# Patient Record
Sex: Female | Born: 1960 | Race: Black or African American | Hispanic: No | Marital: Married | Smoking: Never smoker
Health system: Southern US, Community
[De-identification: ages and names within clinical notes are randomized; demographics above are authoritative.]

---

## 1998-02-08 ENCOUNTER — Other Ambulatory Visit: Admission: RE | Admit: 1998-02-08 | Discharge: 1998-02-08 | Payer: Self-pay | Admitting: Internal Medicine

## 1998-06-05 ENCOUNTER — Other Ambulatory Visit: Admission: RE | Admit: 1998-06-05 | Discharge: 1998-06-05 | Payer: Self-pay | Admitting: Internal Medicine

## 1999-02-19 ENCOUNTER — Other Ambulatory Visit: Admission: RE | Admit: 1999-02-19 | Discharge: 1999-02-19 | Payer: Self-pay | Admitting: Internal Medicine

## 1999-08-08 ENCOUNTER — Encounter: Admission: RE | Admit: 1999-08-08 | Discharge: 1999-08-08 | Payer: Self-pay | Admitting: Internal Medicine

## 1999-08-08 ENCOUNTER — Encounter: Payer: Self-pay | Admitting: Internal Medicine

## 2000-03-19 ENCOUNTER — Other Ambulatory Visit: Admission: RE | Admit: 2000-03-19 | Discharge: 2000-03-19 | Payer: Self-pay | Admitting: Internal Medicine

## 2001-03-29 ENCOUNTER — Other Ambulatory Visit: Admission: RE | Admit: 2001-03-29 | Discharge: 2001-03-29 | Payer: Self-pay | Admitting: Internal Medicine

## 2002-04-13 ENCOUNTER — Encounter: Admission: RE | Admit: 2002-04-13 | Discharge: 2002-04-13 | Payer: Self-pay | Admitting: Internal Medicine

## 2002-04-13 ENCOUNTER — Encounter: Payer: Self-pay | Admitting: Internal Medicine

## 2005-02-27 ENCOUNTER — Encounter: Admission: RE | Admit: 2005-02-27 | Discharge: 2005-02-27 | Payer: Self-pay | Admitting: Internal Medicine

## 2007-01-31 IMAGING — CR DG FOOT COMPLETE 3+V*L*
3 series · 3 of 3 positions shown · non-contrast
Comparison: None.

CLINICAL DATA: Pain in the region of the fifth metatarsal.  Question stress fracture. 
LEFT FOOT ? 3 VIEW:

[view not recorded (1 of 3)]
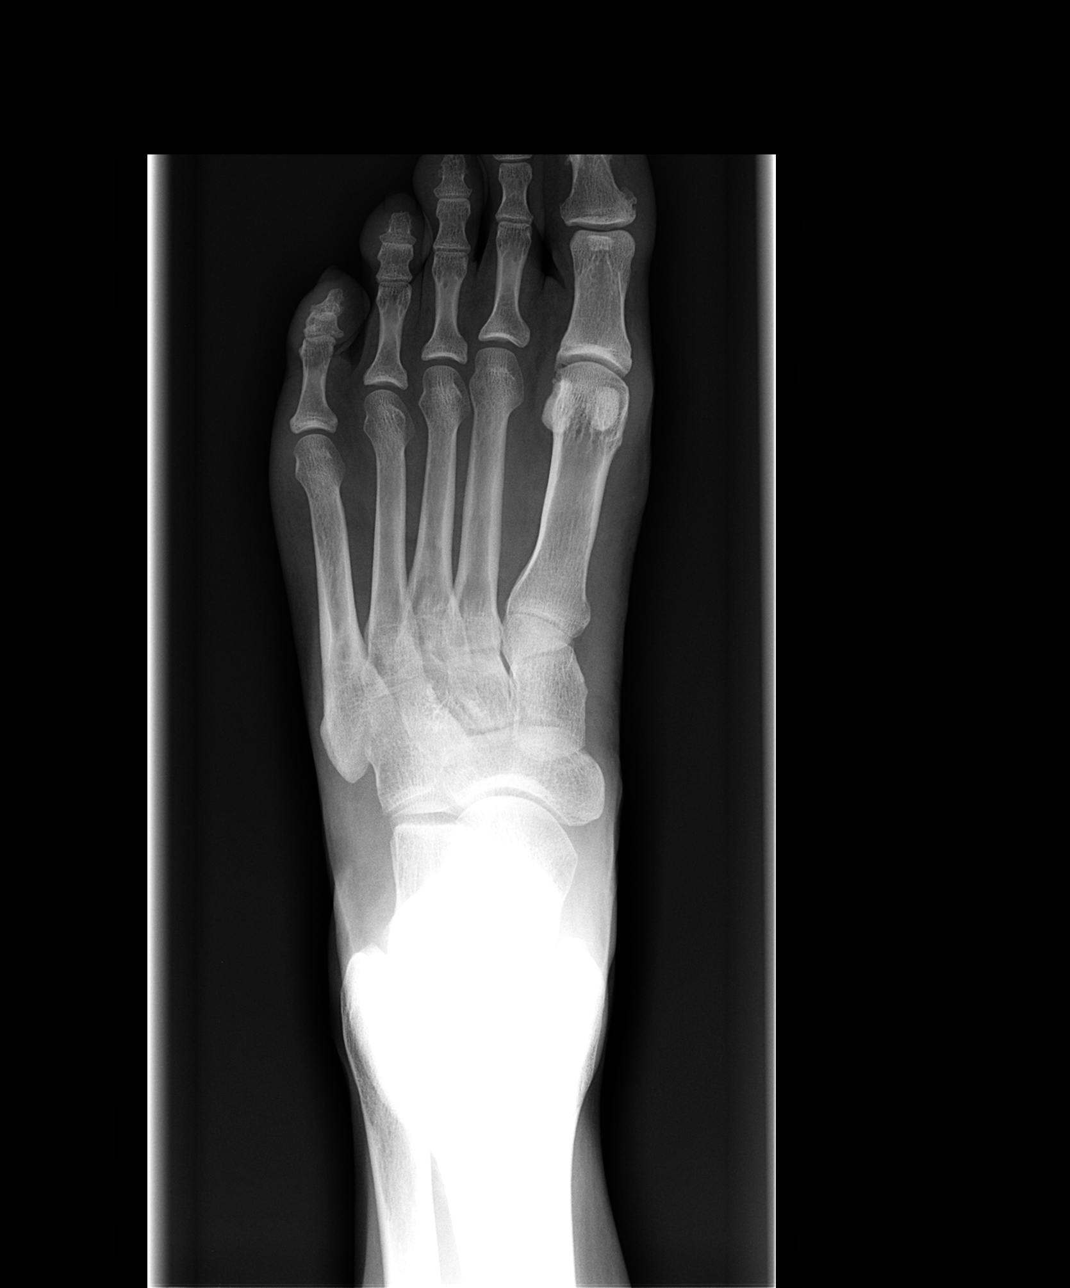

[view not recorded (2 of 3)]
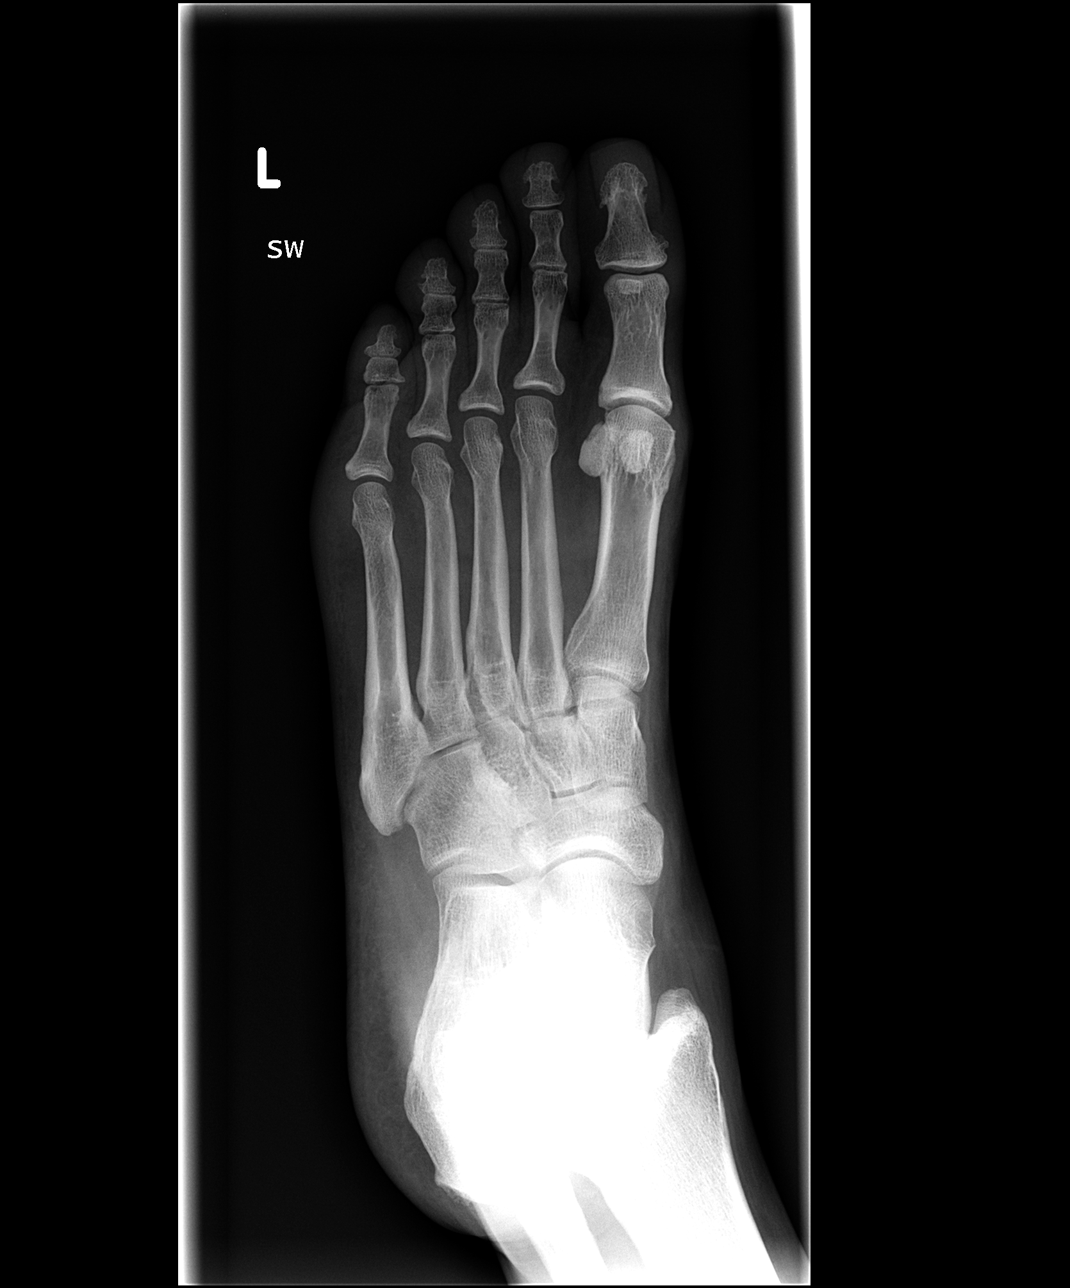

[view not recorded (3 of 3)]
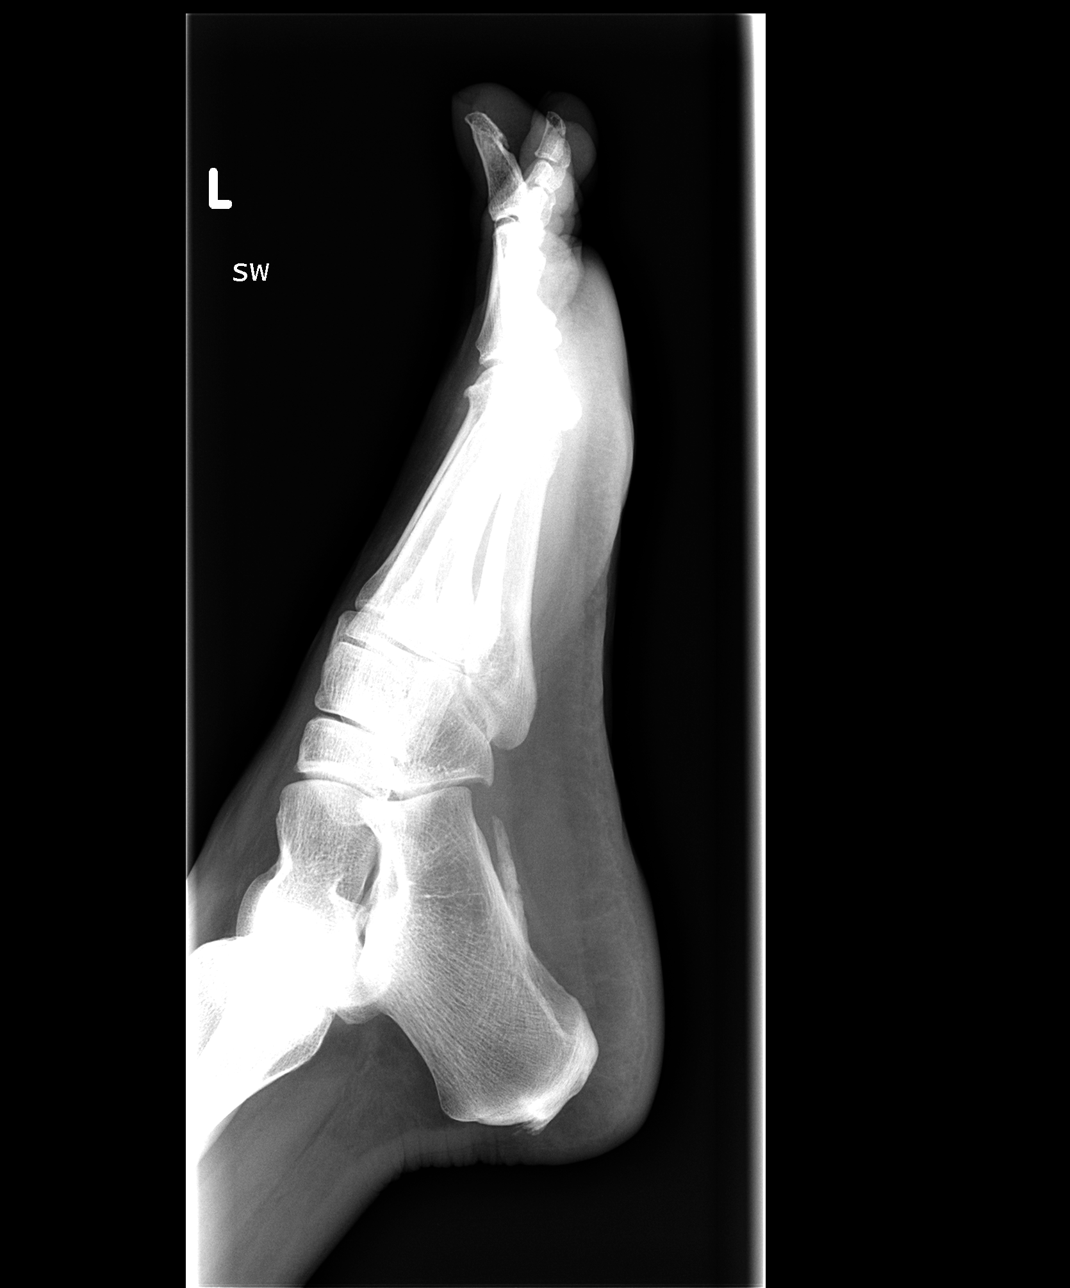

[3 of 3 positions shown; findings below may reference images not displayed]

FINDINGS: Three view exam of the left foot shows no acute fracture or evidence for dislocation.  On the lateral film, there is a prominent linear spur from the undersurface of the calcaneus measuring approximately 3 cm long.  This is somewhat deeper than would be expected for the plantar fascia but this could be related to position.  Alternatively this may represent a calcified tendon.   In either case it likely reflects sequela of chronic or repetitive trauma.
IMPRESSION: Prominent calcified linear structure seen on the lateral film along the undersurface of the calcaneus.  This probably represents calcification of a tendon or deep plantar fascia (please see above) related to repetitive or chronic trauma.  Given that this appears to correspond to the region of the patient?s symptoms, MRI may prove helpful to more definitively localize the mineralization.

## 2011-09-18 ENCOUNTER — Ambulatory Visit: Payer: TRICARE For Life (TFL) | Admitting: Rehabilitative and Restorative Service Providers"

## 2011-09-18 ENCOUNTER — Ambulatory Visit: Attending: Podiatry | Admitting: Physical Therapy

## 2011-09-18 DIAGNOSIS — M25579 Pain in unspecified ankle and joints of unspecified foot: Secondary | ICD-10-CM | POA: Insufficient documentation

## 2011-09-18 DIAGNOSIS — IMO0001 Reserved for inherently not codable concepts without codable children: Secondary | ICD-10-CM | POA: Insufficient documentation

## 2011-09-18 DIAGNOSIS — M25673 Stiffness of unspecified ankle, not elsewhere classified: Secondary | ICD-10-CM | POA: Insufficient documentation

## 2011-09-18 DIAGNOSIS — M25676 Stiffness of unspecified foot, not elsewhere classified: Secondary | ICD-10-CM | POA: Insufficient documentation

## 2011-09-24 ENCOUNTER — Ambulatory Visit: Admitting: Physical Therapy

## 2011-09-24 ENCOUNTER — Encounter

## 2011-09-26 ENCOUNTER — Ambulatory Visit: Admitting: Physical Therapy

## 2011-09-30 ENCOUNTER — Encounter: Admitting: Physical Therapy

## 2011-10-02 ENCOUNTER — Ambulatory Visit: Attending: Podiatry | Admitting: Physical Therapy

## 2011-10-02 DIAGNOSIS — M25676 Stiffness of unspecified foot, not elsewhere classified: Secondary | ICD-10-CM | POA: Insufficient documentation

## 2011-10-02 DIAGNOSIS — IMO0001 Reserved for inherently not codable concepts without codable children: Secondary | ICD-10-CM | POA: Insufficient documentation

## 2011-10-02 DIAGNOSIS — M25579 Pain in unspecified ankle and joints of unspecified foot: Secondary | ICD-10-CM | POA: Insufficient documentation

## 2011-10-02 DIAGNOSIS — M25673 Stiffness of unspecified ankle, not elsewhere classified: Secondary | ICD-10-CM | POA: Insufficient documentation

## 2014-03-01 ENCOUNTER — Other Ambulatory Visit: Payer: Self-pay | Admitting: Internal Medicine

## 2014-03-01 ENCOUNTER — Ambulatory Visit
Admission: RE | Admit: 2014-03-01 | Discharge: 2014-03-01 | Disposition: A | Discharge status: Home / Self Care | Source: Ambulatory Visit | Attending: Internal Medicine | Admitting: Internal Medicine

## 2014-03-01 DIAGNOSIS — R05 Cough: Secondary | ICD-10-CM

## 2014-03-01 DIAGNOSIS — R059 Cough, unspecified: Secondary | ICD-10-CM

## 2015-11-22 ENCOUNTER — Ambulatory Visit (INDEPENDENT_AMBULATORY_CARE_PROVIDER_SITE_OTHER): Admitting: Podiatry

## 2015-11-22 VITALS — BP 152/81 | HR 89

## 2015-11-22 DIAGNOSIS — M7661 Achilles tendinitis, right leg: Secondary | ICD-10-CM | POA: Diagnosis not present

## 2015-11-22 DIAGNOSIS — M25571 Pain in right ankle and joints of right foot: Secondary | ICD-10-CM | POA: Diagnosis not present

## 2015-11-22 DIAGNOSIS — M6528 Calcific tendinitis, other site: Secondary | ICD-10-CM

## 2015-11-22 DIAGNOSIS — M7660 Achilles tendinitis, unspecified leg: Secondary | ICD-10-CM

## 2015-11-22 DIAGNOSIS — M216X1 Other acquired deformities of right foot: Secondary | ICD-10-CM

## 2015-11-22 NOTE — Patient Instructions (Signed)
Seen for pain in right posterior heel. X-ray show excess spur formation with multiple bone fragments at Achilles tendon attachment site. Reviewed options, injection, NSAIA, rest with brace. Placed in Pneumatic CAM walker. Avoid barefoot or flat shoes. Will re evaluate in 3 weeks.

## 2015-11-22 NOTE — Progress Notes (Signed)
SUBJECTIVE: 55 y.o. year old NIDDM female presents with a painful knot on back of right heel. Pain got bad 4 days ago (Monday), but been off and on for over 3 months, since July.  The bump was formed several years ago. She was treated with physical therapy for tight Achilles tendon on right. Since then she was stretching on her own. Stated that she noted the bump got bigger in last 3 months.  REVIEW OF SYSTEMS: A comprehensive review of systems was negative except for: Blood pressure that is under control with medication.   OBJECTIVE: DERMATOLOGIC EXAMINATION: No open skin lesions. VASCULAR EXAMINATION OF LOWER LIMBS: Pedal pulses: All pedal pulses are palpable with normal pulsation.  No edema or erythema noted. No increase in temperature at affected posterior right heel. No soft tissue swelling noted.   NEUROLOGIC EXAMINATION OF THE LOWER LIMBS: Achilles DTR is present and within normal. Sharp and Dull discriminatory sensations at the plantar ball of hallux is intact bilateral.   MUSCULOSKELETAL EXAMINATION: Firm enlarged posterior heel right at Achilles tendon insertion site. Tight Achilles tendon on right, normal on left.   RADIOGRAPHIC STUDIES:  AP View:  Short first metatarsal in rectus foot with Fibular sesamoid position at 3 bilateral Lateral view:  Multiple osseous fragments at Achilles tendon attachment site right foot.  Mild spurring at posterior heel left foot Elevated first metatarsal  L>R, with spurring at dorsal surface of first metatarsal head bilateral. Excess bone formation along the body of calcaneus, running parallel to inferior wall at plantar cortical surface right foot.   ASSESSMENT: Achilles tendonitis at insertion site right posterior heel. Haglund's deformity right. Bone spur right. Achilles tendonitis right.  PLAN: Reviewed clinical and radiographic findings and available treatment options. Advised to use CAM walker for the next 4 weeks and use NSAIA  as needed. Avoid flat shoes or barefoot. Return in 3 weeks for follow up.

## 2015-11-23 ENCOUNTER — Other Ambulatory Visit: Payer: Self-pay

## 2015-11-23 ENCOUNTER — Encounter: Payer: Self-pay | Admitting: Podiatry

## 2015-11-23 DIAGNOSIS — M7661 Achilles tendinitis, right leg: Secondary | ICD-10-CM | POA: Insufficient documentation

## 2015-12-13 ENCOUNTER — Ambulatory Visit (INDEPENDENT_AMBULATORY_CARE_PROVIDER_SITE_OTHER): Admitting: Podiatry

## 2015-12-13 ENCOUNTER — Encounter: Payer: Self-pay | Admitting: Podiatry

## 2015-12-13 DIAGNOSIS — M25571 Pain in right ankle and joints of right foot: Secondary | ICD-10-CM

## 2015-12-13 DIAGNOSIS — M7660 Achilles tendinitis, unspecified leg: Secondary | ICD-10-CM

## 2015-12-13 DIAGNOSIS — M216X1 Other acquired deformities of right foot: Secondary | ICD-10-CM | POA: Diagnosis not present

## 2015-12-13 DIAGNOSIS — M6528 Calcific tendinitis, other site: Secondary | ICD-10-CM | POA: Diagnosis not present

## 2015-12-13 NOTE — Progress Notes (Signed)
SUBJECTIVE: 55 y.o. year old NIDDM female presents stating that her foot is doing well. CAM walker helped a lot. Sore and swelling has gone down. Not taking any medication now.  Works at [employer].  Does stretch exercise as needed according to instruction given at Physical therapy 2 years ago.   HPI:  Started acute tendonitis at the back of right heel on  11/19/15, but been off and on regularly since July 2017.  The bump was formed several years ago and noted of growing last 3 months. She was treated with physical therapy for tight Achilles tendon on right, 2015. Since then she was stretching on her own. She has 784-53 year old custom orthotics that are uncomfortable to wear and not in use at this time.   OBJECTIVE: DERMATOLOGIC EXAMINATION: No open skin lesions.  VASCULAR EXAMINATION OF LOWER LIMBS: All pedal pulses are palpable with normal pulsation.  No edema or erythema noted. No increase in temperature at affected posterior right heel. No s oft tissue swelling noted.   NEUROLOGIC EXAMINATION OF THE LOWER LIMBS: Achilles DTR is present and within normal. Sharp and Dull discriminatory sensations at the plantar ball of hallux is intact bilateral.   MUSCULOSKELETAL EXAMINATION: Firm enlarged posterior heel right at Achilles tendon insertion site. Tight Achilles tendon on right, normal on left.   ASSESSMENT: Achilles tendonitis at insertion site right posterior heel improving with CAM walker. Haglund's deformity right. Ankle Equinus right.  PLAN: Ok to return to boots with raised heel. Use CAM walker as needed. Avoid flat shoes or barefoot. Reviewed benefit of new pair custom orthotics to prevent recurring problem.  Return in 3 weeks for follow up.

## 2015-12-13 NOTE — Patient Instructions (Addendum)
Seen for follow up on right heel pain. CAM walker is helping. OK to use high heel boots. Need custom orthotics to use with regular walking shoes. Will call with insurance info.

## 2016-06-18 ENCOUNTER — Encounter: Payer: Self-pay | Admitting: Podiatry

## 2016-06-18 ENCOUNTER — Ambulatory Visit (INDEPENDENT_AMBULATORY_CARE_PROVIDER_SITE_OTHER): Admitting: Podiatry

## 2016-06-18 VITALS — Ht 67.0 in | Wt 185.0 lb

## 2016-06-18 DIAGNOSIS — M79675 Pain in left toe(s): Secondary | ICD-10-CM

## 2016-06-18 DIAGNOSIS — M79672 Pain in left foot: Secondary | ICD-10-CM

## 2016-06-18 DIAGNOSIS — S99922A Unspecified injury of left foot, initial encounter: Secondary | ICD-10-CM

## 2016-06-18 DIAGNOSIS — S99921A Unspecified injury of right foot, initial encounter: Secondary | ICD-10-CM

## 2016-06-18 DIAGNOSIS — M79605 Pain in left leg: Secondary | ICD-10-CM

## 2016-06-18 NOTE — Patient Instructions (Signed)
Seen for left foot injury. Noted of soft tissue injury without bone involvement. Coban wrap placed and Metatarsal binder dispensed to use during the day. If needed will use Ankle brace. Return as needed.

## 2016-06-18 NOTE — Progress Notes (Signed)
SUBJECTIVE: 56 y.o.year old NIDDM femalepresents stating that she remember twisting her left foot on Saturday (06/14/16). Then the foot start swelling since. Pain with ambulation. Stated that right heel is doing well and still uses CAM walker as needed if it flares up. Marland Kitchen CAM walker has helped a lot. Sore and swelling has gone down. Not taking any medication now.  Works at [employer].  Does stretch exercise as needed according to instruction given at Physical therapy 2 years ago.   HPI:  Started acute tendonitis at the back of right heel on  11/19/15, but been off and on regularly since July 2017.  The bump was formed several years ago and noted of growing last 3 months. She was treated with physical therapy for tight Achilles tendon on right, 2015. Since then she was stretching on her own. She has 59-81 year old custom orthotics that are uncomfortable to wear and not in use at this time.   OBJECTIVE: DERMATOLOGIC EXAMINATION: No open skin lesions.  VASCULAR EXAMINATION OF LOWER LIMBS: All pedal pulses are palpable with normal pulsation.  Mild localized edema over the CCJ left foot proximal to the 4th and 5th MCJ and distal to ankle joint. No increase in temperature at affected posterior right heel. No s oft tissue swelling noted.   NEUROLOGIC EXAMINATION OF THE LOWER LIMBS: Achilles DTR is present and within normal. Sharp and Dull discriminatory sensations at the plantar ball of hallux is intact bilateral.   MUSCULOSKELETAL EXAMINATION: Firm enlarged posterior heel right at Achilles tendon insertion site. Tight Achilles tendon on right, normal on left.   ASSESSMENT: S/P Achilles tendonitis at insertion site right posterior heel resolved. Haglund's deformity with ankle Equinus right. Injured left foot with Tenosynovitis dorsum lesser MCJ.   Radiographic examination of the right foot reveal no acute changes in AP and lateral view.  PLAN: Reviewed clinical findings and available  treatment option. Left foot placed in compression bandage with Coban and Metatarsal binder.

## 2016-07-21 ENCOUNTER — Ambulatory Visit (HOSPITAL_BASED_OUTPATIENT_CLINIC_OR_DEPARTMENT_OTHER): Admitting: Family

## 2016-07-21 ENCOUNTER — Other Ambulatory Visit: Payer: Self-pay | Admitting: Family

## 2016-07-21 ENCOUNTER — Other Ambulatory Visit (HOSPITAL_BASED_OUTPATIENT_CLINIC_OR_DEPARTMENT_OTHER)

## 2016-07-21 ENCOUNTER — Ambulatory Visit

## 2016-07-21 VITALS — BP 163/85 | HR 89 | Temp 98.1°F | Resp 18 | Ht 67.0 in | Wt 209.8 lb

## 2016-07-21 DIAGNOSIS — D72829 Elevated white blood cell count, unspecified: Secondary | ICD-10-CM

## 2016-07-21 LAB — CBC WITH DIFFERENTIAL (CANCER CENTER ONLY)
BASO#: 0.1 10*3/uL (ref 0.0–0.2)
BASO%: 0.4 % (ref 0.0–2.0)
EOS ABS: 0.1 10*3/uL (ref 0.0–0.5)
EOS%: 0.8 % (ref 0.0–7.0)
HEMATOCRIT: 43.3 % (ref 34.8–46.6)
HGB: 14.2 g/dL (ref 11.6–15.9)
LYMPH#: 3.4 10*3/uL — ABNORMAL HIGH (ref 0.9–3.3)
LYMPH%: 21 % (ref 14.0–48.0)
MCH: 28.6 pg (ref 26.0–34.0)
MCHC: 32.8 g/dL (ref 32.0–36.0)
MCV: 87 fL (ref 81–101)
MONO#: 1.1 10*3/uL — ABNORMAL HIGH (ref 0.1–0.9)
MONO%: 7 % (ref 0.0–13.0)
NEUT%: 70.8 % (ref 39.6–80.0)
NEUTROS ABS: 11.3 10*3/uL — AB (ref 1.5–6.5)
PLATELETS: 325 10*3/uL (ref 145–400)
RBC: 4.97 10*6/uL (ref 3.70–5.32)
RDW: 14.6 % (ref 11.1–15.7)
WBC: 15.9 10*3/uL — AB (ref 3.9–10.0)

## 2016-07-21 LAB — COMPREHENSIVE METABOLIC PANEL
ALBUMIN: 3.9 g/dL (ref 3.5–5.0)
ALT: 22 U/L (ref 0–55)
ANION GAP: 10 meq/L (ref 3–11)
AST: 19 U/L (ref 5–34)
Alkaline Phosphatase: 82 U/L (ref 40–150)
BUN: 14.8 mg/dL (ref 7.0–26.0)
CALCIUM: 9.4 mg/dL (ref 8.4–10.4)
CO2: 27 mEq/L (ref 22–29)
Chloride: 105 mEq/L (ref 98–109)
Creatinine: 0.8 mg/dL (ref 0.6–1.1)
GLUCOSE: 107 mg/dL (ref 70–140)
POTASSIUM: 3.3 meq/L — AB (ref 3.5–5.1)
SODIUM: 143 meq/L (ref 136–145)
Total Bilirubin: 0.37 mg/dL (ref 0.20–1.20)
Total Protein: 7 g/dL (ref 6.4–8.3)

## 2016-07-21 LAB — LACTATE DEHYDROGENASE: LDH: 250 U/L — AB (ref 125–245)

## 2016-07-21 LAB — CHCC SATELLITE - SMEAR

## 2016-07-21 NOTE — Progress Notes (Addendum)
Hematology/Oncology Consultation   Name: Gabriella Todd      MRN: [id number]    Location: Room/bed info not found  Date: 07/21/2016 Time:11:47 AM   REFERRING PHYSICIAN: Andi Devon, MD  REASON FOR CONSULT: Elevated WBC count   DIAGNOSIS: Mild Leukocytosis - reactive  HISTORY OF PRESENT ILLNESS: Gabriella Todd is a very pleasant 56 yo African American female with history of leukocytosis. She is asymptomatic with this. WBC count today is   She does have issues with generalized arthritic pain.  She has had no issue with frequent infections. She states gets a sinus infection and bronchitis one to two times a year (with the change in seasons) but otherwise has no problem.  No fever, chills, n/v, cough, rash, dizziness, SOB, chest pain, palpitations, abdominal pain or changes in bowel or bladder habits.  She sprained her left foot a week or so ago and still has some mild puffiness. She has an ace wrap around her toes and part of her foot at this time. No numbness or tingling in her extremities. No c/o pain at this time.  No lymphadenopathy found on exam. No episodes of bleeding , bruising or petechiae.  She has maintained a good appetite and is staying well hydrated. Her weight is stable. She denies any significant weight loss or gain.  No family history of leukocytosis.  No sickle cell disease or trait. No personal history of cancer. Familial cancer history only includes her maternal grandmother with an unknown primary.  Surgical history includes C-section and exploritory lap for what was thought to be a hernia and turned out to be endometriosis.  She has never been a smoker and does not drink alcohol.  She still has a cycle regularly and is on Nora-Be oral contraceptive at this time.  She takes Metformin as well as many supplements which could certainly cause an elevated WBC count.   She has one grown child and no history of miscarriage.  She work as an Production designer, theatre/television/film for [employer].   She  walks regularly for exercise.   ROS: All other 10 point review of systems is negative.   PAST MEDICAL HISTORY:   No past medical history on file.  ALLERGIES: No Known Allergies    MEDICATIONS:  Current Outpatient Prescriptions on File Prior to Visit  Medication Sig Dispense Refill  . aspirin EC 81 MG tablet Take 81 mg by mouth daily.    . cyclobenzaprine (FLEXERIL) 10 MG tablet   0  . meloxicam (MOBIC) 15 MG tablet Take 15 mg by mouth daily.    . metFORMIN (GLUCOPHAGE) 500 MG tablet Take by mouth 2 (two) times daily with a meal.    . norethindrone (MICRONOR,CAMILA,ERRIN) 0.35 MG tablet Take 1 tablet by mouth daily.    Marland Kitchen triamterene-hydrochlorothiazide (MAXZIDE-25) 37.5-25 MG tablet Take 1 tablet by mouth daily.     No current facility-administered medications on file prior to visit.      PAST SURGICAL HISTORY No past surgical history on file.  FAMILY HISTORY: No family history on file.  SOCIAL HISTORY:  reports that she has never smoked. She has never used smokeless tobacco. Her alcohol and drug histories are not on file.  PERFORMANCE STATUS: The patient's performance status is 0 - Asymptomatic  PHYSICAL EXAM: Most Recent Vital Signs: There were no vitals taken for this visit. BP (!) 163/85 (BP Location: Right Arm, Patient Position: Sitting)   Pulse 89   Temp 98.1 F (36.7 C) (Oral)   Resp 18  Ht 5\' 7"  (1.702 m)   Wt 209 lb 12.8 oz (95.2 kg)   SpO2 97%   BMI 32.86 kg/m   General Appearance:    Alert, cooperative, no distress, appears stated age  Head:    Normocephalic, without obvious abnormality, atraumatic  Eyes:    PERRL, conjunctiva/corneas clear, EOM's intact, fundi    benign, both eyes        Throat:   Lips, mucosa, and tongue normal; teeth and gums normal  Neck:   Supple, symmetrical, trachea midline, no adenopathy;    thyroid:  no enlargement/tenderness/nodules; no carotid   bruit or JVD  Back:     Symmetric, no curvature, ROM normal, no CVA  tenderness  Lungs:     Clear to auscultation bilaterally, respirations unlabored  Chest Wall:    No tenderness or deformity   Heart:    Regular rate and rhythm, S1 and S2 normal, no murmur, rub   or gallop     Abdomen:     Soft, non-tender, bowel sounds active all four quadrants,    no masses, no organomegaly        Extremities:   Extremities normal, atraumatic, no cyanosis or edema  Pulses:   2+ and symmetric all extremities  Skin:   Skin color, texture, turgor normal, no rashes or lesions  Lymph nodes:   Cervical, supraclavicular, and axillary nodes normal  Neurologic:   CNII-XII intact, normal strength, sensation and reflexes    throughout    LABORATORY DATA:  Results for orders placed or performed in visit on 07/21/16 (from the past 48 hour(s))  CBC w/Diff     Status: Abnormal   Collection Time: 07/21/16 11:24 AM  Result Value Ref Range   WBC 15.9 (H) 3.9 - 10.0 10e3/uL   RBC 4.97 3.70 - 5.32 10e6/uL   HGB 14.2 11.6 - 15.9 g/dL   HCT 91.443.3 78.234.8 - 95.646.6 %   MCV 87 81 - 101 fL   MCH 28.6 26.0 - 34.0 pg   MCHC 32.8 32.0 - 36.0 g/dL   RDW 21.314.6 08.611.1 - 57.815.7 %   Platelets 325 145 - 400 10e3/uL   NEUT# 11.3 (H) 1.5 - 6.5 10e3/uL   LYMPH# 3.4 (H) 0.9 - 3.3 10e3/uL   MONO# 1.1 (H) 0.1 - 0.9 10e3/uL   Eosinophils Absolute 0.1 0.0 - 0.5 10e3/uL   BASO# 0.1 0.0 - 0.2 10e3/uL   NEUT% 70.8 39.6 - 80.0 %   LYMPH% 21.0 14.0 - 48.0 %   MONO% 7.0 0.0 - 13.0 %   EOS% 0.8 0.0 - 7.0 %   BASO% 0.4 0.0 - 2.0 %  Smear     Status: None   Collection Time: 07/21/16 11:24 AM  Result Value Ref Range   Smear Result Smear Available       RADIOGRAPHY: No results found.     PATHOLOGY: None  ASSESSMENT/PLAN: Gabriella Todd is a very pleasant 56 yo [place] with history of leukocytosis likely reactive due to arthritis and multiple medications. She is asymptomatic with the WBC count of 15.9. No anemia. Platelet count is stable at 325. No lymphadenopathy found on exam.  Smear viewed by Dr.  Myna Todd sowed no abnormality of evidence of malignancy.  We will plan to see her back for follow-up and repeat lab work in 4 months.  All questions were answered. She will contact our office with any questions or concerns. We can certainly see her much sooner if necessary.  She was  discussed with and also seen by Dr. Myna Hidalgo and he is in agreement with the aforementioned.   [facility]   ADDENDUM:  I saw and examined the patient with Selam Pietsch. I would get her blood smear under the microscope. She had mature white blood cells. I do not see anything that looked immature. There were no blasts. There were no nucleated red blood cells.  I had believe that the leukocytosis is reactive. I cannot imagine that there is any type of underlying bone marrow disorder that we have to worry about.  Her exam is unremarkable for lymphadenopathy or splenomegaly. She takes a lot of supplements and natural medications. It is possible that when these might because in the leukocytosis.  I would like to see her back in about 4 months. If all is good in 4 months, then we can probably let her go from the clinic.  We spent about 40 minutes with her today. We answered all of her questions. We mostly reassured her that she would be the healthiest person that we see all week. She was happy to hear that.  Christin Bach, MD

## 2016-11-19 ENCOUNTER — Ambulatory Visit (HOSPITAL_BASED_OUTPATIENT_CLINIC_OR_DEPARTMENT_OTHER): Admitting: Family

## 2016-11-19 ENCOUNTER — Other Ambulatory Visit (HOSPITAL_BASED_OUTPATIENT_CLINIC_OR_DEPARTMENT_OTHER)

## 2016-11-19 VITALS — BP 162/83 | HR 85 | Temp 98.4°F | Resp 20 | Wt 207.0 lb

## 2016-11-19 DIAGNOSIS — D72829 Elevated white blood cell count, unspecified: Secondary | ICD-10-CM | POA: Diagnosis not present

## 2016-11-19 LAB — CBC WITH DIFFERENTIAL (CANCER CENTER ONLY)
BASO#: 0.1 10*3/uL (ref 0.0–0.2)
BASO%: 0.8 % (ref 0.0–2.0)
EOS%: 1.6 % (ref 0.0–7.0)
Eosinophils Absolute: 0.2 10*3/uL (ref 0.0–0.5)
HEMATOCRIT: 41.9 % (ref 34.8–46.6)
HEMOGLOBIN: 13.7 g/dL (ref 11.6–15.9)
LYMPH#: 3.9 10*3/uL — AB (ref 0.9–3.3)
LYMPH%: 30 % (ref 14.0–48.0)
MCH: 27.8 pg (ref 26.0–34.0)
MCHC: 32.7 g/dL (ref 32.0–36.0)
MCV: 85 fL (ref 81–101)
MONO#: 0.9 10*3/uL (ref 0.1–0.9)
MONO%: 6.8 % (ref 0.0–13.0)
NEUT%: 60.8 % (ref 39.6–80.0)
NEUTROS ABS: 7.8 10*3/uL — AB (ref 1.5–6.5)
Platelets: 367 10*3/uL (ref 145–400)
RBC: 4.93 10*6/uL (ref 3.70–5.32)
RDW: 14.8 % (ref 11.1–15.7)
WBC: 12.9 10*3/uL — ABNORMAL HIGH (ref 3.9–10.0)

## 2016-11-19 LAB — COMPREHENSIVE METABOLIC PANEL (CC13)
ALBUMIN: 4.5 g/dL (ref 3.5–5.5)
ALT: 19 IU/L (ref 0–32)
AST (SGOT): 15 IU/L (ref 0–40)
Albumin/Globulin Ratio: 1.6 (ref 1.2–2.2)
Alkaline Phosphatase, S: 97 IU/L (ref 39–117)
BUN / CREAT RATIO: 19 (ref 9–23)
BUN: 14 mg/dL (ref 6–24)
Bilirubin Total: 0.3 mg/dL (ref 0.0–1.2)
CO2: 25 mmol/L (ref 20–29)
CREATININE: 0.73 mg/dL (ref 0.57–1.00)
Calcium, Ser: 10 mg/dL (ref 8.7–10.2)
Chloride, Ser: 101 mmol/L (ref 96–106)
GFR calc Af Amer: 108 mL/min/{1.73_m2} (ref >59)
GFR, EST NON AFRICAN AMERICAN: 94 mL/min/{1.73_m2} (ref >59)
GLOBULIN, TOTAL: 2.9 g/dL (ref 1.5–4.5)
GLUCOSE: 103 mg/dL — AB (ref 65–99)
Potassium, Ser: 3.5 mmol/L (ref 3.5–5.2)
SODIUM: 140 mmol/L (ref 134–144)
TOTAL PROTEIN: 7.4 g/dL (ref 6.0–8.5)

## 2016-11-19 LAB — CHCC SATELLITE - SMEAR

## 2016-11-19 NOTE — Progress Notes (Signed)
Hematology and Oncology Follow Up Visit  Gabriella Todd [id number] 1960/07/21 56 y.o. 11/19/2016   Principle Diagnosis:  Mild Leukocytosis - reactive  Current Therapy:   Observation   Interim History:  Gabriella Todd is here today for follow-up. She is doing well and has no complaints at this time. WBC count is stable at 12.9. No anemia, white cell differential is unremarkable. Platelet count is 367.  She has had no issue with infections. No fever, chills, n/v, cough, rash, dizziness, SOB, chest pain, palpitations, abdominal pain or changes in bowel or bladder habits.  No swelling, numbness or tingling in her extremities. She has generalized arthritic aches and pains that come and go.  Her blood sugars are well controlled on Metformin.  She has maintained a good appetite and is staying well hydrated. Her weight is stable.   ECOG Performance Status: 0 - Asymptomatic  Medications:  Allergies as of 11/19/2016   No Known Allergies     Medication List       Accurate as of 11/19/16  1:48 PM. Always use your most recent med list.          ALIGN 4 MG Caps Take by mouth daily.   Apple Cider Vinegar 500 MG Tabs Take by mouth daily.   ASPIR-81 PO Aspir-81   B COMPLETE PO B Complete  Take 1 capsule daily   Biotin 5000 MCG Tabs Take by mouth daily.   cholecalciferol 400 units Tabs tablet Commonly known as:  VITAMIN D Take 400 Units by mouth.   co-enzyme Q-10 30 MG capsule Take 300 mg by mouth daily.   cyclobenzaprine 10 MG tablet Commonly known as:  FLEXERIL   diclofenac sodium 1 % Gel Commonly known as:  VOLTAREN diclofenac 1 % topical gel   EDARBYCLOR 40-12.5 MG Tabs Generic drug:  Azilsartan-Chlorthalidone Edarbyclor 40 mg-12.5 mg tablet  Take 1 tablet every day by oral route.   Fish Oil 1200 MG Cpdr Take by mouth daily.   fluticasone 50 MCG/ACT nasal spray Commonly known as:  FLONASE fluticasone 50 mcg/actuation nasal spray,suspension  USE 1 TO 2 SPRAYS IN EACH  NOSTRIL ONCE DAILY   magnesium oxide 400 MG tablet Commonly known as:  MAG-OX Take 400 mg by mouth daily.   meloxicam 15 MG tablet Commonly known as:  MOBIC Take 15 mg by mouth daily.   metFORMIN 500 MG tablet Commonly known as:  GLUCOPHAGE Take by mouth 2 (two) times daily with a meal.   milk thistle 175 MG tablet Take 175 mg by mouth daily.   norethindrone 0.35 MG tablet Commonly known as:  MICRONOR,CAMILA,ERRIN Take 1 tablet by mouth daily.   potassium chloride 10 MEQ tablet Commonly known as:  K-DUR,KLOR-CON Take 10 mEq by mouth daily.   TURMERIC PO Take by mouth daily.       Allergies: No Known Allergies  Past Medical History, Surgical history, Social history, and Family History were reviewed and updated.  Review of Systems: All other 10 point review of systems is negative.   Physical Exam:  weight is 207 lb (93.9 kg). Her temperature is 98.4 F (36.9 C). Her blood pressure is 162/83 (abnormal) and her pulse is 85. Her respiration is 20 and oxygen saturation is 99%.   Wt Readings from Last 3 Encounters:  11/19/16 207 lb (93.9 kg)  07/21/16 209 lb 12.8 oz (95.2 kg)  06/18/16 185 lb (83.9 kg)    Ocular: Sclerae unicteric, pupils equal, round and reactive to light Ear-nose-throat: Oropharynx clear,  dentition fair Lymphatic: No cervical, supraclavicular or axillary adenopathy Lungs no rales or rhonchi, good excursion bilaterally Heart regular rate and rhythm, no murmur appreciated Abd soft, nontender, positive bowel sounds, no liver or spleen tip palpated on exam, no fluid wave MSK no focal spinal tenderness, no joint edema Neuro: non-focal, well-oriented, appropriate affect Breasts: Deferred   Lab Results  Component Value Date   WBC 12.9 (H) 11/19/2016   HGB 13.7 11/19/2016   HCT 41.9 11/19/2016   MCV 85 11/19/2016   PLT 367 11/19/2016   No results found for: FERRITIN, IRON, TIBC, UIBC, IRONPCTSAT Lab Results  Component Value Date   RBC 4.93  11/19/2016   No results found for: KPAFRELGTCHN, LAMBDASER, KAPLAMBRATIO No results found for: IGGSERUM, IGA, IGMSERUM No results found for: Marda Stalker, SPEI   Chemistry      Component Value Date/Time   NA 143 07/21/2016 1124   K 3.3 (L) 07/21/2016 1124   CO2 27 07/21/2016 1124   BUN 14.8 07/21/2016 1124   CREATININE 0.8 07/21/2016 1124      Component Value Date/Time   CALCIUM 9.4 07/21/2016 1124   ALKPHOS 82 07/21/2016 1124   AST 19 07/21/2016 1124   ALT 22 07/21/2016 1124   BILITOT 0.37 07/21/2016 1124      Impression and Plan: Gabriella Todd is a very pleasant 56 yo African American female with leukocytosis - reactive (arthritis and medication). She continues to do well and has no complaints at this time.  Her WBC count is stable at 12.9, no anemia. At this point we can let her go from our office and follow-up only as need for any heme/onc issues in the future.  She is in agreement with this and promises to contact our office with any questions or concerns. We can certainly see her back any time she might need [place]!   Verdie Mosher, NP 9/19/20181:48 PM

## 2018-08-25 LAB — HM COLONOSCOPY

## 2018-09-09 DIAGNOSIS — K645 Perianal venous thrombosis: Secondary | ICD-10-CM | POA: Insufficient documentation

## 2018-09-09 DIAGNOSIS — K629 Disease of anus and rectum, unspecified: Secondary | ICD-10-CM | POA: Insufficient documentation

## 2019-05-13 ENCOUNTER — Ambulatory Visit: Attending: Internal Medicine

## 2019-05-13 DIAGNOSIS — Z23 Encounter for immunization: Secondary | ICD-10-CM

## 2019-05-13 NOTE — Progress Notes (Signed)
   Covid-19 Vaccination Clinic  Name:  Gabriella Todd    MRN: [id number] DOB: 1960/11/26  05/13/2019  [name] was observed post Covid-19 immunization for 15 minutes without incident. She was provided with Vaccine Information Sheet and instruction to access the V-Safe system.   [name] was instructed to call 911 with any severe reactions post vaccine: Marland Kitchen Difficulty breathing  . Swelling of face and throat  . A fast heartbeat  . A bad rash all over body  . Dizziness and weakness   Immunizations Administered    Name Date Dose VIS Date Route   Pfizer COVID-19 Vaccine 05/13/2019  1:27 PM 0.3 mL 02/11/2019 Intramuscular   Manufacturer: [employer], [employer]   Lot: [id number]   NDC: [address]-2

## 2019-06-08 ENCOUNTER — Ambulatory Visit: Attending: Internal Medicine

## 2019-06-08 DIAGNOSIS — Z23 Encounter for immunization: Secondary | ICD-10-CM

## 2019-06-08 NOTE — Progress Notes (Signed)
   Covid-19 Vaccination Clinic  Name:  Gabriella Todd    MRN: [id number] DOB: 1960/05/10  06/08/2019  [name] was observed post Covid-19 immunization for 15 minutes without incident. She was provided with Vaccine Information Sheet and instruction to access the V-Safe system.   [name] was instructed to call 911 with any severe reactions post vaccine: Marland Kitchen Difficulty breathing  . Swelling of face and throat  . A fast heartbeat  . A bad rash all over body  . Dizziness and weakness   Immunizations Administered    Name Date Dose VIS Date Route   Pfizer COVID-19 Vaccine 06/08/2019  4:37 PM 0.3 mL 02/11/2019 Intramuscular   Manufacturer: [employer], [employer]   Lot: [id number]   NDC: [address]-7

## 2022-05-02 DIAGNOSIS — G5601 Carpal tunnel syndrome, right upper limb: Secondary | ICD-10-CM | POA: Insufficient documentation

## 2023-01-07 ENCOUNTER — Other Ambulatory Visit: Payer: Self-pay | Admitting: Obstetrics and Gynecology

## 2023-01-07 DIAGNOSIS — E049 Nontoxic goiter, unspecified: Secondary | ICD-10-CM

## 2023-01-08 ENCOUNTER — Ambulatory Visit
Admission: RE | Admit: 2023-01-08 | Discharge: 2023-01-08 | Disposition: A | Discharge status: Home / Self Care | Source: Ambulatory Visit | Attending: Obstetrics and Gynecology | Admitting: Obstetrics and Gynecology

## 2023-01-08 DIAGNOSIS — E049 Nontoxic goiter, unspecified: Secondary | ICD-10-CM

## 2023-01-08 LAB — HM PAP SMEAR: HPV, high-risk: NEGATIVE

## 2023-01-19 ENCOUNTER — Other Ambulatory Visit: Payer: Self-pay | Admitting: Obstetrics and Gynecology

## 2023-01-19 DIAGNOSIS — E042 Nontoxic multinodular goiter: Secondary | ICD-10-CM

## 2023-01-20 LAB — HM MAMMOGRAPHY

## 2023-03-13 ENCOUNTER — Ambulatory Visit
Admission: RE | Admit: 2023-03-13 | Discharge: 2023-03-13 | Disposition: A | Discharge status: Home / Self Care | Source: Ambulatory Visit | Attending: Obstetrics and Gynecology | Admitting: Obstetrics and Gynecology

## 2023-03-13 ENCOUNTER — Inpatient Hospital Stay: Admission: RE | Admit: 2023-03-13 | Source: Ambulatory Visit

## 2023-03-13 ENCOUNTER — Other Ambulatory Visit (HOSPITAL_COMMUNITY)
Admission: RE | Admit: 2023-03-13 | Discharge: 2023-03-13 | Disposition: A | Discharge status: Home / Self Care | Source: Ambulatory Visit | Attending: Interventional Radiology | Admitting: Interventional Radiology

## 2023-03-13 DIAGNOSIS — E041 Nontoxic single thyroid nodule: Secondary | ICD-10-CM | POA: Diagnosis present

## 2023-03-13 DIAGNOSIS — E042 Nontoxic multinodular goiter: Secondary | ICD-10-CM

## 2023-03-17 LAB — CYTOLOGY - NON PAP

## 2023-09-28 ENCOUNTER — Ambulatory Visit: Payer: Self-pay

## 2023-09-28 NOTE — Telephone Encounter (Signed)
 FYI Only or Action Required?: FYI only for provider.  Patient was last seen in primary care on N/A, new patient.  Called Nurse Triage reporting Fall, Knee Pain, and Back Pain.  Symptoms began 1-2 weeks.  Interventions attempted: Nothing.  Symptoms are: bilateral knee pain and swelling, lower back pain, fall x 1 yesterday, nausea stable.  Triage Disposition: See PCP When Office is Open (Within 3 Days)- patient scheduled for new patient appointment, advised to go to urgent care  Patient/caregiver understands and will follow disposition?: Yes                 Copied from CRM #[id number]. Topic: Clinical - Red Word Triage >> Sep 28, 2023  2:09 PM Larissa RAMAN wrote: [facility] that prompted transfer to Nurse Triage: Pt fell on 07/27. Patient has pain in knees and back pain Reason for Disposition  [1] MODERATE back pain (e.g., interferes with normal activities) AND [2] present > 3 days  Answer Assessment - Initial Assessment Questions Denies LOC, head injury, confusion, dizziness  1. MECHANISM: How did the fall happen?     Patient states she was at church, she was on her knees at the alter. She states she tried to stand up and wasn't able to stand up fully before she fell backwards.  2. DOMESTIC VIOLENCE AND ELDER ABUSE SCREENING: Did you fall because someone pushed you or tried to hurt you? If Yes, ask: Are you safe now?     No abuse.  3. ONSET: When did the fall happen? (e.g., minutes, hours, or days ago)     Yesterday, 09/27/23.  4. LOCATION: What part of the body hit the ground? (e.g., back, buttocks, head, hips, knees, hands, head, stomach)     Buttocks and back.  5. INJURY: Did you hurt (injure) yourself when you fell? If Yes, ask: What did you injure? Tell me more about this? (e.g., body area; type of injury; pain severity)     Patient states no injuries. Patient states her knee and back pain was prior to the fall.   6. PAIN: Is there any pain? If Yes,  ask: How bad is the pain? (e.g., Scale 0-10; or none, mild,      Lower back pain, 4-5/10. Comes and goes, x 1 week. 3/10 Bilateral knees swelling and pain x 2 weeks.  7. SIZE: For cuts, bruises, or swelling, ask: How large is it? (e.g., inches or centimeters)      None.  8. PREGNANCY: Is there any chance you are pregnant? When was your last menstrual period?     N/A.  9. OTHER SYMPTOMS: Do you have any other symptoms? (e.g., dizziness, fever, weakness; new-onset or worsening).      Diarrhea with nausea and vomiting and abdominal cramps when camping Wednesday night. Patient states the other symptoms have resolved but she still has nausea (patient states she is able to eat and drink).   10. CAUSE: What do you think caused the fall (or falling)? (e.g., dizzy spell, tripped)       Unsure.  Protocols used: Falls and Falling-A-AH, Back Pain-A-AH

## 2023-12-21 ENCOUNTER — Encounter (HOSPITAL_BASED_OUTPATIENT_CLINIC_OR_DEPARTMENT_OTHER): Payer: Self-pay | Admitting: *Deleted

## 2023-12-21 ENCOUNTER — Ambulatory Visit (HOSPITAL_BASED_OUTPATIENT_CLINIC_OR_DEPARTMENT_OTHER): Admitting: Family Medicine

## 2023-12-21 VITALS — BP 136/78 | HR 85 | Ht 67.0 in | Wt 184.5 lb

## 2023-12-21 DIAGNOSIS — K9041 Non-celiac gluten sensitivity: Secondary | ICD-10-CM | POA: Insufficient documentation

## 2023-12-21 DIAGNOSIS — E1165 Type 2 diabetes mellitus with hyperglycemia: Secondary | ICD-10-CM | POA: Insufficient documentation

## 2023-12-21 DIAGNOSIS — R140 Abdominal distension (gaseous): Secondary | ICD-10-CM

## 2023-12-21 DIAGNOSIS — I10 Essential (primary) hypertension: Secondary | ICD-10-CM | POA: Insufficient documentation

## 2023-12-21 DIAGNOSIS — Z7689 Persons encountering health services in other specified circumstances: Secondary | ICD-10-CM | POA: Diagnosis not present

## 2023-12-21 DIAGNOSIS — G5601 Carpal tunnel syndrome, right upper limb: Secondary | ICD-10-CM | POA: Diagnosis not present

## 2023-12-21 DIAGNOSIS — E663 Overweight: Secondary | ICD-10-CM | POA: Insufficient documentation

## 2023-12-21 DIAGNOSIS — R7309 Other abnormal glucose: Secondary | ICD-10-CM | POA: Insufficient documentation

## 2023-12-21 DIAGNOSIS — Z860100 Personal history of colon polyps, unspecified: Secondary | ICD-10-CM | POA: Insufficient documentation

## 2023-12-21 DIAGNOSIS — E559 Vitamin D deficiency, unspecified: Secondary | ICD-10-CM | POA: Insufficient documentation

## 2023-12-21 DIAGNOSIS — J309 Allergic rhinitis, unspecified: Secondary | ICD-10-CM | POA: Insufficient documentation

## 2023-12-21 MED ORDER — MELOXICAM 7.5 MG PO TABS: 7.5000 mg | ORAL_TABLET | Freq: Every day | ORAL | 0 refills | Status: AC

## 2023-12-21 NOTE — Progress Notes (Signed)
 New Patient Office Visit  Subjective:   Gabriella Todd 1960/06/17 12/21/2023  Chief Complaint  Patient presents with   New Patient (Initial Visit)    Patient is here today to get established with the practice. States she has been having problems with joint swelling along with numbness in fingers and also abdominal problems.    Discussed the use of AI scribe software for clinical note transcription with the patient, who gave verbal consent to proceed.  History of Present Illness Gabriella Todd is a 63 year old female with diabetes who presents to establish care with numbness in her fingertips and abdominal pain.  Last PCP: Suzen Lamer, MD  Last annual physical: Internal Medicine Jackquelyn, MD) Concerns: See below   CARPAL TUNNEL:  She experiences numbness in her fingertips, primarily in her right hand, which has been intermittent but recently more persistent. This numbness began approximately a year and a half ago after her retirement. She notes difficulty with tasks such as snapping her fingers. She has a history of using a brace and a nerve treatment advertised on TV, which provided some relief but was costly. She also mentions a potential link to gel nail polish, as she read articles suggesting it could cause similar symptoms. Her career with [employer] involved extensive typing and mouse use, which she believes may have contributed to her hand symptoms. She is a side sleeper, which may exacerbate her hand symptoms at night.  GLP 1 SIDE EFFECT:  She has diabetes, with her A1c currently well-controlled at around 6.7. She has been experiencing abdominal pain, bloating, and nausea, particularly the day after receiving her weekly Ozempic injection. She has been on the 0.5 mg dose for about a month, and these symptoms were also present at the lower 0.25 mg dose. She finds Ozempic more tolerable than metformin, which previously caused significant gastrointestinal distress. She  uses Align probiotics and Metamucil to manage these symptoms, but still experiences constipation and bloating. No blood in stool or dark, tarry stools. Reports regular bowel movements.   The following portions of the patient's history were reviewed and updated as appropriate: past medical history, past surgical history, family history, social history, allergies, medications, and problem list.   Patient Active Problem List   Diagnosis Date Noted   Gluten intolerance 12/21/2023   Benign essential hypertension 12/21/2023   History of colonic polyps 12/21/2023   Hyperglycemia due to type 2 diabetes mellitus (HCC) 12/21/2023   Carpal tunnel syndrome of right wrist 05/02/2022   Leukocytosis 07/21/2016   Tendonitis, Achilles, right 11/23/2015   Past Medical History:  Diagnosis Date   Abnormal glucose level 12/21/2023   Allergic rhinitis 12/21/2023   Anal lesion 09/09/2018   External hemorrhoid, thrombosed 09/09/2018   Past Surgical History:  Procedure Laterality Date   CESAREAN SECTION  [id number]   Family History  Problem Relation Age of Onset   Diabetes Father    Heart disease Maternal Grandfather    Cancer Maternal Grandmother    Social History   Socioeconomic History   Marital status: Married    Spouse name: Not on file   Number of children: Not on file   Years of education: Not on file   Highest education level: Associate degree: occupational, Scientist, product/process development, or vocational program  Occupational History   Not on file  Tobacco Use   Smoking status: Never   Smokeless tobacco: Never  Substance and Sexual Activity   Alcohol use: Never   Drug use: Never  Sexual activity: Yes    Birth control/protection: None  Other Topics Concern   Not on file  Social History Narrative   Not on file   Social Drivers of Health   Financial Resource Strain: Low Risk  (12/21/2023)   Overall Financial Resource Strain (CARDIA)    Difficulty of Paying Living Expenses: Not very hard  Food  Insecurity: Not on file  Transportation Needs: No Transportation Needs (12/21/2023)   PRAPARE - Administrator, Civil Service (Medical): No    Lack of Transportation (Non-Medical): No  Physical Activity: Insufficiently Active (12/21/2023)   Exercise Vital Sign    Days of Exercise per Week: 2 days    Minutes of Exercise per Session: 10 min  Stress: No Stress Concern Present (12/21/2023)   [employer] of Occupational Health - Occupational Stress Questionnaire    Feeling of Stress: Only a little  Social Connections: Socially Integrated (12/21/2023)   Social Connection and Isolation Panel    Frequency of Communication with Friends and Family: More than three times a week    Frequency of Social Gatherings with Friends and Family: Once a week    Attends Religious Services: More than 4 times per year    Active Member of [employer] or Organizations: Yes    Attends Banker Meetings: More than 4 times per year    Marital Status: Married  Catering manager Violence: Unknown (06/04/2021)   Received from [facility]   HITS    Physically Hurt: Not on file    Insult or Talk Down To: Not on file    Threaten Physical Harm: Not on file    Scream or Curse: Not on file   Outpatient Medications Prior to Visit  Medication Sig Dispense Refill   amLODipine (NORVASC) 10 MG tablet Take 10 mg by mouth daily.     Aspirin (ASPIR-81 PO) Aspir-81     Azilsartan-Chlorthalidone (EDARBYCLOR) 40-12.5 MG TABS Edarbyclor 40 mg-12.5 mg tablet  Take 1 tablet every day by oral route.     B Complex-Biotin-FA (B COMPLETE PO) B Complete  Take 1 capsule daily     Biotin 5000 MCG TABS Take by mouth daily.     cholecalciferol (VITAMIN D) 400 units TABS tablet Take 400 Units by mouth.     co-enzyme Q-10 30 MG capsule Take 300 mg by mouth daily.     cyclobenzaprine (FLEXERIL) 10 MG tablet   0   diclofenac sodium (VOLTAREN) 1 % GEL diclofenac 1 % topical gel     empagliflozin (JARDIANCE) 10 MG TABS  tablet 10 mg.     fluticasone (FLONASE) 50 MCG/ACT nasal spray fluticasone 50 mcg/actuation nasal spray,suspension  USE 1 TO 2 SPRAYS IN EACH NOSTRIL ONCE DAILY     magnesium oxide (MAG-OX) 400 MG tablet Take 400 mg by mouth daily.     Omega-3 Fatty Acids (FISH OIL) 1200 MG CPDR Take by mouth daily.     OZEMPIC, 0.25 OR 0.5 MG/DOSE, 2 MG/3ML SOPN Inject 0.5 mg every week by subcutaneous route.     potassium chloride (K-DUR,KLOR-CON) 10 MEQ tablet Take 10 mEq by mouth daily.     Probiotic Product (ALIGN) 4 MG CAPS Take by mouth daily.     TURMERIC PO Take by mouth daily.     Apple Cider Vinegar 500 MG TABS Take by mouth daily.     meloxicam (MOBIC) 15 MG tablet Take 15 mg by mouth daily.     metFORMIN (GLUCOPHAGE) 500 MG tablet Take  by mouth 2 (two) times daily with a meal.      milk thistle 175 MG tablet Take 175 mg by mouth daily.     norethindrone (MICRONOR,CAMILA,ERRIN) 0.35 MG tablet Take 1 tablet by mouth daily.     No facility-administered medications prior to visit.   Allergies  Allergen Reactions   Iodine Itching   Gluten Meal Nausea And Vomiting   Metformin Diarrhea    metformin    ROS: A complete ROS was performed with pertinent positives/negatives noted in the HPI. The remainder of the ROS are negative.   Objective:   Today's Vitals   12/21/23 1306 12/21/23 1344  BP: (!) 150/76 136/78  Pulse: 85   SpO2: 100%   Weight: 184 lb 8 oz (83.7 kg)   Height: 5' 7 (1.702 m)     GENERAL: Well-appearing, in NAD. Well nourished.  SKIN: Pink, warm and dry. No rash, lesion, ulceration, or ecchymoses.  Head: Normocephalic. NECK: Trachea midline. Full ROM w/o pain or tenderness. RESPIRATORY: Chest wall symmetrical. Respirations even and non-labored. Breath sounds clear to auscultation bilaterally.  CARDIAC: S1, S2 present, regular rate and rhythm without murmur or gallops. Peripheral pulses 2+ bilaterally.  MSK: Muscle tone and strength appropriate for age. Joints w/o swelling.  + Phalen's sign to RUE. Full grip strength and ROM present to bilateral upper extremities.  EXTREMITIES: Without clubbing, cyanosis, or edema.  NEUROLOGIC: No motor or sensory deficits. Steady, even gait. C2-C12 intact.  PSYCH/MENTAL STATUS: Alert, oriented x 3. Cooperative, appropriate mood and affect.      Assessment & Plan:  1. Encounter to establish care with new doctor (Primary) Discussed role of PCP and reviewed health history. Recent OV with previous PCP reviewed today. Patient states she will decide if she wants to switch from current Internal Med PCP and will let us  know.   2. Abdominal bloating Discussed GLP 1 side effects with patient and recommend consideration of trial of Mounjaro and stopping with Ozempic use. Will consider and let PCP know if desiring to change  3. Carpal tunnel syndrome of right wrist Recommend use of Mobic 7.5 mg once daily x 1 week, ice, bracing, and stretches provided. Discussed possible referral to Dr. Camella if no improvement. Patient verbalized.   Patient to reach out to office if new, worrisome, or unresolved symptoms arise or if no improvement in patient's condition. Patient verbalized understanding and is agreeable to treatment plan. All questions answered to patient's satisfaction.    Return in about 3 months (around 03/22/2024) for ANNUAL PHYSICAL, DIABETES CHECK UP (fasting labs at visit) .    Thersia Schuyler Stark, [place]

## 2023-12-21 NOTE — Patient Instructions (Signed)
  VISIT SUMMARY: Today, we discussed your ongoing issues with numbness in your fingertips and abdominal pain. We reviewed your diabetes management and the side effects you are experiencing from your current medication, Ozempic. We also touched on your blood pressure and general health maintenance.  YOUR PLAN: -CARPAL TUNNEL SYNDROME, RIGHT HAND: Carpal tunnel syndrome is a condition where the median nerve in your wrist gets compressed, causing numbness and tingling in your hand. To help manage this, you are prescribed meloxicam 7.5 mg to take with food as needed for one week. You should also wear a wrist brace at night, perform wrist and hand stretching exercises, and ice your wrist several times a day. If your symptoms do not improve, we may refer you to Dr. Camella, a hand specialist.  -TYPE 2 DIABETES MELLITUS: Type 2 diabetes is a condition where your body does not use insulin properly, leading to high blood sugar levels. Your blood sugar levels are well-controlled with an A1c of approximately 6.7. We will continue to monitor this.  -ADVERSE EFFECTS OF GLP-1 AGONIST THERAPY (OZEMPIC): You are experiencing gastrointestinal side effects from Ozempic, which is common with this type of medication. If these symptoms persist, we may consider switching you to a different medication called Mounjaro. To help with constipation, you can start taking Colace. Continue using Align probiotics and Metamucil to manage your symptoms.  -HYPERTENSION: Hypertension is high blood pressure. Your blood pressure has improved to 136/78 mmHg, which is a good sign. We will continue to monitor this.  INSTRUCTIONS: Please reassess your carpal tunnel symptoms after one week. If your symptoms persist, we may need to refer you to a hand specialist. If the gastrointestinal side effects from Ozempic do not improve, we will discuss the possibility of switching to Mounjaro.

## 2024-04-11 ENCOUNTER — Encounter (HOSPITAL_BASED_OUTPATIENT_CLINIC_OR_DEPARTMENT_OTHER): Admitting: Family Medicine
